# Patient Record
Sex: Male | Born: 1960 | Race: Black or African American | Hispanic: No | Marital: Married | Smoking: Never smoker
Health system: Southern US, Community
[De-identification: ages and names within clinical notes are randomized; demographics above are authoritative.]

## PROBLEM LIST (undated history)

## (undated) DIAGNOSIS — I10 Essential (primary) hypertension: Secondary | ICD-10-CM

---

## 1998-04-28 ENCOUNTER — Ambulatory Visit (HOSPITAL_COMMUNITY): Admission: RE | Admit: 1998-04-28 | Discharge: 1998-04-28 | Payer: Self-pay | Admitting: Infectious Diseases

## 2009-01-02 ENCOUNTER — Encounter: Admission: RE | Admit: 2009-01-02 | Discharge: 2009-01-02 | Payer: Self-pay | Admitting: Cardiovascular Disease

## 2009-02-14 ENCOUNTER — Encounter: Admission: RE | Admit: 2009-02-14 | Discharge: 2009-02-14 | Payer: Self-pay | Admitting: Cardiovascular Disease

## 2016-07-15 ENCOUNTER — Emergency Department (HOSPITAL_COMMUNITY)
Admission: EM | Admit: 2016-07-15 | Discharge: 2016-07-15 | Disposition: A | Discharge status: Home / Self Care | Payer: Commercial Managed Care - PPO | Attending: Emergency Medicine | Admitting: Emergency Medicine

## 2016-07-15 ENCOUNTER — Encounter (HOSPITAL_COMMUNITY): Payer: Self-pay | Admitting: Emergency Medicine

## 2016-07-15 ENCOUNTER — Emergency Department (HOSPITAL_COMMUNITY): Payer: Commercial Managed Care - PPO

## 2016-07-15 DIAGNOSIS — I10 Essential (primary) hypertension: Secondary | ICD-10-CM | POA: Diagnosis not present

## 2016-07-15 DIAGNOSIS — Y999 Unspecified external cause status: Secondary | ICD-10-CM | POA: Insufficient documentation

## 2016-07-15 DIAGNOSIS — S3992XA Unspecified injury of lower back, initial encounter: Secondary | ICD-10-CM | POA: Diagnosis present

## 2016-07-15 DIAGNOSIS — S39012A Strain of muscle, fascia and tendon of lower back, initial encounter: Secondary | ICD-10-CM | POA: Insufficient documentation

## 2016-07-15 DIAGNOSIS — Y92410 Unspecified street and highway as the place of occurrence of the external cause: Secondary | ICD-10-CM | POA: Insufficient documentation

## 2016-07-15 DIAGNOSIS — Z79899 Other long term (current) drug therapy: Secondary | ICD-10-CM | POA: Insufficient documentation

## 2016-07-15 DIAGNOSIS — Y939 Activity, unspecified: Secondary | ICD-10-CM | POA: Insufficient documentation

## 2016-07-15 HISTORY — DX: Essential (primary) hypertension: I10

## 2016-07-15 MED ORDER — CYCLOBENZAPRINE HCL 10 MG PO TABS
10.0000 mg | ORAL_TABLET | Freq: Once | ORAL | Status: AC
  Administered 2016-07-15: 10 mg via ORAL
  Filled 2016-07-15: qty 1

## 2016-07-15 MED ORDER — CYCLOBENZAPRINE HCL 10 MG PO TABS: 10.0000 mg | ORAL_TABLET | Freq: Two times a day (BID) | ORAL | 0 refills | Status: DC | PRN

## 2016-07-15 MED ORDER — NAPROXEN 500 MG PO TABS: 500.0000 mg | ORAL_TABLET | Freq: Two times a day (BID) | ORAL | 0 refills | Status: DC
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
## 2016-07-15 NOTE — ED Notes (Signed)
See providers assessment.  

## 2016-07-15 NOTE — Discharge Instructions (Signed)
Do not drive while taking the muscle relaxant as it will make you sleepy. °

## 2016-08-31 ENCOUNTER — Other Ambulatory Visit: Payer: Self-pay | Admitting: Cardiovascular Disease

## 2016-08-31 ENCOUNTER — Ambulatory Visit
Admission: RE | Admit: 2016-08-31 | Discharge: 2016-08-31 | Disposition: A | Discharge status: Home / Self Care | Payer: Commercial Managed Care - PPO | Source: Ambulatory Visit | Attending: Cardiovascular Disease | Admitting: Cardiovascular Disease

## 2016-08-31 DIAGNOSIS — T148XXA Other injury of unspecified body region, initial encounter: Secondary | ICD-10-CM

## 2016-09-28 ENCOUNTER — Ambulatory Visit (INDEPENDENT_AMBULATORY_CARE_PROVIDER_SITE_OTHER): Payer: Commercial Managed Care - PPO | Admitting: Physician Assistant

## 2016-09-28 VITALS — BP 136/80 | HR 76 | Temp 98.2°F | Resp 16 | Ht 67.0 in | Wt 216.0 lb

## 2016-09-28 DIAGNOSIS — B9789 Other viral agents as the cause of diseases classified elsewhere: Secondary | ICD-10-CM

## 2016-09-28 DIAGNOSIS — J069 Acute upper respiratory infection, unspecified: Secondary | ICD-10-CM | POA: Diagnosis not present

## 2016-09-28 MED ORDER — IPRATROPIUM BROMIDE 0.03 % NA SOLN: 2.0000 | Freq: Two times a day (BID) | NASAL | 0 refills | Status: DC

## 2016-09-28 MED ORDER — GUAIFENESIN ER 1200 MG PO TB12: 1.0000 | ORAL_TABLET | Freq: Two times a day (BID) | ORAL | 1 refills | Status: DC | PRN

## 2016-09-28 NOTE — Progress Notes (Signed)
[facility] [address], [place] [id number] [phone]  Date:  09/28/2016   Name:  Geoffrey Thompson   DOB:  1961-05-23   MRN:  [id number]  PCP:  Ricki RodriguezKADAKIA,AJAY S, MD    History of Present Illness:  Geoffrey Thompson is a 56 y.o. male patient [facility] of generalized body aches, and nasal congestion. Patient reports symptoms that started about 3 days ago.   He has no sore throat.  Nasal congestion and some runny-nose. He has no fever.  No chills or malaise.     There are no active problems to display for this patient.   Past Medical History:  Diagnosis Date  . Hypertension     No past surgical history on file.  Social History  Substance Use Topics  . Smoking status: Never Smoker  . Smokeless tobacco: Never Used  . Alcohol use No    No family history on file.  No Known Allergies  Medication list has been reviewed and updated.  No current outpatient prescriptions on file prior to visit.   No current facility-administered medications on file prior to visit.     ROS ROS otherwise unremarkable unless listed above.  Physical Examination: BP 136/80   Pulse 76   Temp 98.2 F (36.8 C) (Oral)   Resp 16   Ht 5\' 7"  (1.702 m)   Wt 216 lb (98 kg)   SpO2 98%   BMI 33.83 kg/m  Ideal Body Weight: Weight in (lb) to have BMI = 25: 159.3  Physical Exam  Constitutional: He is oriented to person, place, and time. He appears well-developed and well-nourished. No distress.  HENT:  Head: Normocephalic and atraumatic.  Right Ear: Tympanic membrane, external ear and ear canal normal.  Left Ear: Tympanic membrane, external ear and ear canal normal.  Nose: Mucosal edema and rhinorrhea present. Right sinus exhibits no maxillary sinus tenderness and no frontal sinus tenderness. Left sinus exhibits no maxillary sinus tenderness and no frontal sinus tenderness.  Mouth/Throat: No uvula swelling. No oropharyngeal exudate, posterior oropharyngeal  edema or posterior oropharyngeal erythema.  Eyes: Conjunctivae, EOM and lids are normal. Pupils are equal, round, and reactive to light. Right eye exhibits normal extraocular motion. Left eye exhibits normal extraocular motion.  Neck: Trachea normal and full passive range of motion without pain. No edema and no erythema present.  Cardiovascular: Normal rate.   Pulmonary/Chest: Effort normal. No respiratory distress. He has no decreased breath sounds. He has no wheezes. He has no rhonchi.  Neurological: He is alert and oriented to person, place, and time.  Skin: Skin is warm and dry. He is not diaphoretic.  Psychiatric: He has a normal mood and affect. His behavior is normal.     Assessment and Plan: Geoffrey Thompson is a 56 y.o. male who is here today for cc of nasal congestion and nausea. Likely viral.  Supportive treatment and anti-pyretic use discussed.  Follow up as needed.  1. Viral upper respiratory infection - Guaifenesin (MUCINEX MAXIMUM STRENGTH) 1200 MG TB12; Take 1 tablet (1,200 mg total) by mouth every 12 (twelve) hours as needed.  Dispense: 14 tablet; Refill: 1 - ipratropium (ATROVENT) 0.03 % nasal spray; Place 2 sprays into both nostrils 2 (two) times daily.  Dispense: 30 mL; Refill: 0   Trena PlattStephanie English, PA-C [facility] 09/28/2016 7:01 PM

## 2016-09-28 NOTE — Patient Instructions (Addendum)
64 oz of water if not more. Please take tylenol or ibuprofen, for pain or fever.  Upper Respiratory Infection, Adult Most upper respiratory infections (URIs) are caused by a virus. A URI affects the nose, throat, and upper air passages. The most common type of URI is often called "the common cold." Follow these instructions at home:  Take medicines only as told by your doctor.  Gargle warm saltwater or take cough drops to comfort your throat as told by your doctor.  Use a warm mist humidifier or inhale steam from a shower to increase air moisture. This may make it easier to breathe.  Drink enough fluid to keep your pee (urine) clear or pale yellow.  Eat soups and other clear broths.  Have a healthy diet.  Rest as needed.  Go back to work when your fever is gone or your doctor says it is okay.  You may need to stay home longer to avoid giving your URI to others.  You can also wear a face mask and wash your hands often to prevent spread of the virus.  Use your inhaler more if you have asthma.  Do not use any tobacco products, including cigarettes, chewing tobacco, or electronic cigarettes. If you need help quitting, ask your doctor. Contact a doctor if:  You are getting worse, not better.  Your symptoms are not helped by medicine.  You have chills.  You are getting more short of breath.  You have brown or red mucus.  You have yellow or brown discharge from your nose.  You have pain in your face, especially when you bend forward.  You have a fever.  You have puffy (swollen) neck glands.  You have pain while swallowing.  You have white areas in the back of your throat. Get help right away if:  You have very bad or constant:  Headache.  Ear pain.  Pain in your forehead, behind your eyes, and over your cheekbones (sinus pain).  Chest pain.  You have long-lasting (chronic) lung disease and any of the following:  Wheezing.  Long-lasting cough.  Coughing  up blood.  A change in your usual mucus.  You have a stiff neck.  You have changes in your:  Vision.  Hearing.  Thinking.  Mood. This information is not intended to replace advice given to you by your health care provider. Make sure you discuss any questions you have with your health care provider. Document Released: 01/04/2008 Document Revised: 03/20/2016 Document Reviewed: 10/23/2013 Elsevier Interactive Patient Education  2017 [employer]     IF you received an x-ray today, you will receive an invoice from [facility]. Please contact [facility] at [phone] with questions or concerns regarding your invoice.   IF you received labwork today, you will receive an invoice from [facility]. Please contact [facility] at [phone] with questions or concerns regarding your invoice.   Our billing staff will not be able to assist you with questions regarding bills from these companies.  You will be contacted with the lab results as soon as they are available. The fastest way to get your results is to activate your My Chart account. Instructions are located on the last page of this paperwork. If you have not heard from us regarding the results in 2 weeks, please contact this office.

## 2016-10-26 ENCOUNTER — Other Ambulatory Visit: Payer: Self-pay | Admitting: Cardiovascular Disease

## 2016-10-26 DIAGNOSIS — M79672 Pain in left foot: Secondary | ICD-10-CM

## 2016-10-26 DIAGNOSIS — S99922A Unspecified injury of left foot, initial encounter: Secondary | ICD-10-CM

## 2016-11-07 ENCOUNTER — Ambulatory Visit
Admission: RE | Admit: 2016-11-07 | Discharge: 2016-11-07 | Disposition: A | Discharge status: Home / Self Care | Payer: Commercial Managed Care - PPO | Source: Ambulatory Visit | Attending: Cardiovascular Disease | Admitting: Cardiovascular Disease

## 2016-11-07 DIAGNOSIS — M79672 Pain in left foot: Secondary | ICD-10-CM

## 2016-11-07 DIAGNOSIS — S99922A Unspecified injury of left foot, initial encounter: Secondary | ICD-10-CM

## 2016-11-24 ENCOUNTER — Ambulatory Visit (INDEPENDENT_AMBULATORY_CARE_PROVIDER_SITE_OTHER): Payer: Commercial Managed Care - PPO | Admitting: Family Medicine

## 2016-11-24 VITALS — BP 134/88 | HR 91 | Temp 99.7°F | Resp 16 | Ht 67.0 in | Wt 218.0 lb

## 2016-11-24 DIAGNOSIS — J069 Acute upper respiratory infection, unspecified: Secondary | ICD-10-CM | POA: Diagnosis not present

## 2016-11-24 DIAGNOSIS — R062 Wheezing: Secondary | ICD-10-CM

## 2016-11-24 DIAGNOSIS — J301 Allergic rhinitis due to pollen: Secondary | ICD-10-CM | POA: Diagnosis not present

## 2016-11-24 MED ORDER — ALBUTEROL SULFATE HFA 108 (90 BASE) MCG/ACT IN AERS: 2.0000 | INHALATION_SPRAY | Freq: Four times a day (QID) | RESPIRATORY_TRACT | 0 refills | Status: DC | PRN

## 2016-11-24 MED ORDER — FLUTICASONE PROPIONATE 50 MCG/ACT NA SUSP: 2.0000 | Freq: Every day | NASAL | 6 refills | Status: DC

## 2016-11-24 NOTE — Patient Instructions (Addendum)
It was good to meet you today.  You do have some swelling in your nose and wheezing in your lungs.  I think you have a combination of allergies and a cold.  Use the Flonase to help with your nose.  Use the Albuterol to help with the wheezing in your lungs.    Communicate with me through Mychart if you're starting to feel worse.      IF you received an x-ray today, you will receive an invoice from [facility]. Please contact [facility] at [phone] with questions or concerns regarding your invoice.   IF you received labwork today, you will receive an invoice from [facility]. Please contact [facility] at [phone] with questions or concerns regarding your invoice.   Our billing staff will not be able to assist you with questions regarding bills from these companies.  You will be contacted with the lab results as soon as they are available. The fastest way to get your results is to activate your My Chart account. Instructions are located on the last page of this paperwork. If you have not heard from [place] regarding the results in 2 weeks, please contact this office.

## 2016-11-24 NOTE — Progress Notes (Signed)
   Geoffrey Thompson is a 56 y.o. male who presents to Primary Care at [facility] today for wheezing:  1.  Wheezing:  Patient without diagnosed asthma who presents with cough and wheezing for past several days.  Describes wheezing and cough worse at night.  He hasn't tried anything for relief.  Cough is dry and nonproductive.  Eating and drinking well.  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  No myalgias.  No N/V.    Not diagnosed with seasonal allergies.  Does have some increased nasal drainage and pressure. Drainage is clear.  No fevers or chills.    Hasn't tried anything for relief.   ROS as above.    PMH reviewed. Patient is a nonsmoker.   Past Medical History:  Diagnosis Date  . Hypertension    No past surgical history on file.  Medications reviewed. Current Outpatient Prescriptions  Medication Sig Dispense Refill  . glipiZIDE (GLUCOTROL XL) 2.5 MG 24 hr tablet Take 2.5 mg by mouth daily with breakfast.    . Multiple Vitamins-Minerals (MULTIVITAMIN ADULT PO) Take by mouth.    . olmesartan-hydrochlorothiazide (BENICAR HCT) 40-12.5 MG tablet Take 1 tablet by mouth daily.    . Guaifenesin (MUCINEX MAXIMUM STRENGTH) 1200 MG TB12 Take 1 tablet (1,200 mg total) by mouth every 12 (twelve) hours as needed. (Patient not taking: Reported on 11/24/2016) 14 tablet 1  . ipratropium (ATROVENT) 0.03 % nasal spray Place 2 sprays into both nostrils 2 (two) times daily. (Patient not taking: Reported on 11/24/2016) 30 mL 0   No current facility-administered medications for this visit.      Physical Exam:  BP 134/88   Pulse 91   Temp 99.7 F (37.6 C) (Oral)   Resp 16   Ht  (1.702 m)   Wt 218 lb (98.9 kg)   SpO2 99%   BMI 34.14 kg/m  Gen:  Patient sitting on exam table, appears stated age in no acute distress Head: Normocephalic atraumatic Eyes: EOMI, PERRL, sclera and conjunctiva non-erythematous Ears:  Canals clear bilaterally.  TMs pearly gray bilaterally without erythema or bulging.    Nose:  Nasal turbinates grossly enlarged bilaterally and boggy appearing.  Clear drainage BL.   Mouth: Mucosa membranes moist. Tonsils +2, nonenlarged, non-erythematous. Neck: No cervical lymphadenopathy noted Heart:  RRR, no murmurs auscultated. Pulm:  Minimal scattered wheezes at bases today.     Assessment and Plan:  1.  Seasonal allergies: - with swelling of turbinates BL  - Flonase to treat.  Also OTC cetirizine  2.  Viral URI: - possibly also viral URI - should run its course in next several days.   3.  Wheezing: - not diagnosed with asthma, but seems to have at least 2 atopic symptoms: allergies plus wheezing.   - FU with [place] or PCP in 4 - 6 weeks once this is clear for PFT testing.

## 2016-11-29 ENCOUNTER — Ambulatory Visit (INDEPENDENT_AMBULATORY_CARE_PROVIDER_SITE_OTHER): Payer: Commercial Managed Care - PPO | Admitting: Urgent Care

## 2016-11-29 ENCOUNTER — Encounter: Payer: Self-pay | Admitting: Urgent Care

## 2016-11-29 ENCOUNTER — Ambulatory Visit (INDEPENDENT_AMBULATORY_CARE_PROVIDER_SITE_OTHER): Payer: Commercial Managed Care - PPO

## 2016-11-29 VITALS — BP 138/84 | HR 89 | Temp 98.2°F | Resp 18 | Ht 67.0 in | Wt 212.0 lb

## 2016-11-29 DIAGNOSIS — R05 Cough: Secondary | ICD-10-CM

## 2016-11-29 DIAGNOSIS — R062 Wheezing: Secondary | ICD-10-CM | POA: Diagnosis not present

## 2016-11-29 DIAGNOSIS — J301 Allergic rhinitis due to pollen: Secondary | ICD-10-CM

## 2016-11-29 DIAGNOSIS — J189 Pneumonia, unspecified organism: Secondary | ICD-10-CM | POA: Diagnosis not present

## 2016-11-29 DIAGNOSIS — R059 Cough, unspecified: Secondary | ICD-10-CM

## 2016-11-29 MED ORDER — CETIRIZINE HCL 10 MG PO TABS: 10.0000 mg | ORAL_TABLET | Freq: Every day | ORAL | 11 refills | Status: DC

## 2016-11-29 MED ORDER — HYDROCODONE-HOMATROPINE 5-1.5 MG/5ML PO SYRP: 5.0000 mL | ORAL_SOLUTION | Freq: Every evening | ORAL | 0 refills | Status: DC | PRN

## 2016-11-29 MED ORDER — AZITHROMYCIN 250 MG PO TABS: ORAL_TABLET | ORAL | 0 refills | Status: DC

## 2016-11-29 MED ORDER — BENZONATATE 100 MG PO CAPS: 100.0000 mg | ORAL_CAPSULE | Freq: Three times a day (TID) | ORAL | 0 refills | Status: DC | PRN

## 2016-11-29 NOTE — Progress Notes (Signed)
  MRN: [id number] DOB: [birth year]  Subjective:   Geoffrey Thompson is a 56 y.o. male presenting for chief complaint of Follow-up (Seasonal allergic rhinitis. )  Last OV was 11/24/2016, was managed for allergic rhinitis with Flonase and supportive care for viral uri. Patient was supposed to f/u for wheezing, PFT testing in 4-6 weeks. However, presents today reporting ongoing wheezing, productive cough, chest tightness, nasal congestion. Cough elicits throat pain. Denies fever, chest pain, shob. Has tried to use Atrovent nasal spray with minimal relief. He is also using albuterol inhaler every 6 hours as needed but does not notice a significant difference in his symptoms. He is also using Claritin on occasion. Denies smoking cigarettes. Denies family history of lung cancer, sarcoidosis.  Geoffrey Thompson has a current medication list which includes the following prescription(s): albuterol, fluticasone, glipizide, multiple vitamins-minerals, olmesartan-hydrochlorothiazide, guaifenesin, and ipratropium. Also has No Known Allergies. Geoffrey Thompson  has a past medical history of Hypertension. Also denies past surgical history.  Objective:   Vitals: BP 138/84   Pulse 89   Temp 98.2 F (36.8 C) (Oral)   Resp 18   Ht  (1.702 m)   Wt 212 lb (96.2 kg)   SpO2 97%   BMI 33.20 kg/m   Physical Exam  Constitutional: He is oriented to person, place, and time. He appears well-developed and well-nourished.  HENT:  TM's intact bilaterally, no effusions or erythema. Nasal turbinates violaceous and boggy, nasal passages moderately patent. No sinus tenderness. Throat with moderate post-nasal drainage, mucous membranes moist.  Eyes: Right eye exhibits no discharge. Left eye exhibits no discharge. No scleral icterus.  Neck: Normal range of motion. Neck supple.  Cardiovascular: Normal rate, regular rhythm and intact distal pulses.  Exam reveals no gallop and no friction rub.   No murmur heard. Pulmonary/Chest: No  respiratory distress. He has wheezes (mid-lower left lung fields). He has no rales.  Lymphadenopathy:    He has no cervical adenopathy.  Neurological: He is alert and oriented to person, place, and time.  Skin: Skin is warm and dry.   Dg Chest 2 View  Result Date: 11/29/2016 CLINICAL DATA:  Cough and wheezing. EXAM: CHEST  2 VIEW COMPARISON:  None. FINDINGS: The heart size and mediastinal contours are within normal limits. Airspace disease is seen in the lingula, suspicious for pneumonia. Right lung is clear. No evidence of pleural effusion. IMPRESSION: Lingular airspace disease, suspicious for pneumonia. Followup PA and lateral chest X-ray is recommended in 3-4 weeks following trial of antibiotic therapy to ensure resolution and exclude underlying malignancy. Electronically Signed   By: Myles Rosenthal M.D.   On: 11/29/2016 17:33   Assessment and Plan :   1. Lingular pneumonia 2. Cough 3. Wheezing - Start azithromycin to address pneumonia. Supportive care is advised otherwise. Patient is to rtc in 4 weeks for repeat x-ray.  4. Seasonal allergic rhinitis due to pollen - Patient is to maintain allergy treatment regimen.  - cetirizine (ZYRTEC) 10 MG tablet; Take 1 tablet (10 mg total) by mouth daily.  Dispense: 30 tablet; Refill: 11   Wallis Bamberg, [place] [facility] [phone] 11/29/2016  5:07 PM

## 2016-11-29 NOTE — Patient Instructions (Addendum)
Community-Acquired Pneumonia, Adult Pneumonia is an infection of the lungs. There are different types of pneumonia. One type can develop while a person is in a hospital. A different type, called community-acquired pneumonia, develops in people who are not, or have not recently been, in the hospital or other health care facility. What are the causes? Pneumonia may be caused by bacteria, viruses, or funguses. Community-acquired pneumonia is often caused by Streptococcus pneumonia bacteria. These bacteria are often passed from one person to another by breathing in droplets from the cough or sneeze of an infected person. What increases the risk? The condition is more likely to develop in:  People who havechronic diseases, such as chronic obstructive pulmonary disease (COPD), asthma, congestive heart failure, cystic fibrosis, diabetes, or kidney disease.  People who haveearly-stage or late-stage HIV.  People who havesickle cell disease.  People who havehad their spleen removed (splenectomy).  People who havepoor Human resources officer.  People who havemedical conditions that increase the risk of breathing in (aspirating) secretions their own mouth and nose.  People who havea weakened immune system (immunocompromised).  People who smoke.  People whotravel to areas where pneumonia-causing germs commonly exist.  People whoare around animal habitats or animals that have pneumonia-causing germs, including birds, bats, rabbits, cats, and farm animals. What are the signs or symptoms? Symptoms of this condition include:  Adry cough.  A wet (productive) cough.  Fever.  Sweating.  Chest pain, especially when breathing deeply or coughing.  Rapid breathing or difficulty breathing.  Shortness of breath.  Shaking chills.  Fatigue.  Muscle aches. How is this diagnosed? Your health care provider will take a medical history and perform a physical exam. You may also have other tests,  including:  Imaging studies of your chest, including X-rays.  Tests to check your blood oxygen level and other blood gases.  Other tests on blood, mucus (sputum), fluid around your lungs (pleural fluid), and urine. If your pneumonia is severe, other tests may be done to identify the specific cause of your illness. How is this treated? The type of treatment that you receive depends on many factors, such as the cause of your pneumonia, the medicines you take, and other medical conditions that you have. For most adults, treatment and recovery from pneumonia may occur at home. In some cases, treatment must happen in a hospital. Treatment may include:  Antibiotic medicines, if the pneumonia was caused by bacteria.  Antiviral medicines, if the pneumonia was caused by a virus.  Medicines that are given by mouth or through an IV tube.  Oxygen.  Respiratory therapy. Although rare, treating severe pneumonia may include:  Mechanical ventilation. This is done if you are not breathing well on your own and you cannot maintain a safe blood oxygen level.  Thoracentesis. This procedureremoves fluid around one lung or both lungs to help you breathe better. Follow these instructions at home:  Take over-the-counter and prescription medicines only as told by your health care provider.  Only takecough medicine if you are losing sleep. Understand that cough medicine can prevent your body's natural ability to remove mucus from your lungs.  If you were prescribed an antibiotic medicine, take it as told by your health care provider. Do not stop taking the antibiotic even if you start to feel better.  Sleep in a semi-upright position at night. Try sleeping in a reclining chair, or place a few pillows under your head.  Do not use tobacco products, including cigarettes, chewing tobacco, and e-cigarettes. If you  tobacco, and e-cigarettes. If you need help quitting, ask your health care provider.  Drink enough water to keep your urine  clear or pale yellow. This will help to thin out mucus secretions in your lungs. How is this prevented? There are ways that you can decrease your risk of developing community-acquired pneumonia. Consider getting a pneumococcal vaccine if:  You are older than 57 years of age.  You are older than 57 years of age and are undergoing cancer treatment, have chronic lung disease, or have other medical conditions that affect your immune system. Ask your health care provider if this applies to you.  There are different types and schedules of pneumococcal vaccines. Ask your health care provider which vaccination option is best for you. You may also prevent community-acquired pneumonia if you take these actions:  Get an influenza vaccine every year. Ask your health care provider which type of influenza vaccine is best for you.  Go to the dentist on a regular basis.  Wash your hands often. Use hand sanitizer if soap and water are not available.  Contact a health care provider if:  You have a fever.  You are losing sleep because you cannot control your cough with cough medicine. Get help right away if:  You have worsening shortness of breath.  You have increased chest pain.  Your sickness becomes worse, especially if you are an older adult or have a weakened immune system.  You cough up blood. This information is not intended to replace advice given to you by your health care provider. Make sure you discuss any questions you have with your health care provider. Document Released: 07/18/2005 Document Revised: 11/26/2015 Document Reviewed: 11/12/2014 Elsevier Interactive Patient Education  2017 [employer]    IF you received an x-ray today, you will receive an invoice from [facility]. Please contact [facility] at [phone] with questions or concerns regarding your invoice.   IF you received labwork today, you will receive an invoice from [facility]. Please contact  [facility] at [phone] with questions or concerns regarding your invoice.   Our billing staff will not be able to assist you with questions regarding bills from these companies.  You will be contacted with the lab results as soon as they are available. The fastest way to get your results is to activate your My Chart account. Instructions are located on the last page of this paperwork. If you have not heard from us regarding the results in 2 weeks, please contact this office.      

## 2016-12-27 ENCOUNTER — Encounter: Payer: Self-pay | Admitting: Urgent Care

## 2016-12-27 ENCOUNTER — Ambulatory Visit (INDEPENDENT_AMBULATORY_CARE_PROVIDER_SITE_OTHER): Payer: Commercial Managed Care - PPO

## 2016-12-27 ENCOUNTER — Ambulatory Visit (INDEPENDENT_AMBULATORY_CARE_PROVIDER_SITE_OTHER): Payer: Commercial Managed Care - PPO | Admitting: Urgent Care

## 2016-12-27 VITALS — BP 120/72 | HR 81 | Temp 98.0°F | Resp 16 | Ht 66.0 in | Wt 212.4 lb

## 2016-12-27 DIAGNOSIS — R05 Cough: Secondary | ICD-10-CM | POA: Diagnosis not present

## 2016-12-27 DIAGNOSIS — J189 Pneumonia, unspecified organism: Secondary | ICD-10-CM | POA: Diagnosis not present

## 2016-12-27 DIAGNOSIS — R062 Wheezing: Secondary | ICD-10-CM | POA: Diagnosis not present

## 2016-12-27 DIAGNOSIS — R059 Cough, unspecified: Secondary | ICD-10-CM

## 2016-12-27 DIAGNOSIS — Z8701 Personal history of pneumonia (recurrent): Secondary | ICD-10-CM

## 2016-12-27 NOTE — Progress Notes (Signed)
    MRN: [id number] DOB: 24-Jan-1961  Subjective:   Geoffrey Thompson is a 56 y.o. male presenting for follow up on pneumonia. Last OV was 11/29/2016, diagnosed with lingular pneumonia. He underwent treatment with course of azithromycin. Recommendation was to f/u in 3-4 weeks for repeat chest x-ray and evaluation of resolution of his pneumonia. Today, denies fever, cough, chest pain, shob, wheezing.    Geoffrey Thompson has a current medication list which includes the following prescription(s): albuterol, azithromycin, benzonatate, cetirizine, fluticasone, glipizide, hydrocodone-homatropine, multiple vitamins-minerals, and olmesartan-hydrochlorothiazide. Also has No Known Allergies.  Geoffrey Thompson  has a past medical history of Hypertension. Also  has no past surgical history on file.  Objective:   Vitals: BP 120/72 (BP Location: Right Arm, Patient Position: Sitting, Cuff Size: Normal)   Pulse 81   Temp 98 F (36.7 C) (Oral)   Resp 16   Ht 5\' 6"  (1.676 m)   Wt 212 lb 6.4 oz (96.3 kg)   SpO2 98%   BMI 34.28 kg/m   Physical Exam  Constitutional: He is oriented to person, place, and time. He appears well-developed and well-nourished.  HENT:  Mouth/Throat: Oropharynx is clear and moist.  Cardiovascular: Normal rate, regular rhythm and intact distal pulses.  Exam reveals no gallop and no friction rub.   No murmur heard. Pulmonary/Chest: No respiratory distress. He has no wheezes. He has no rales.  Neurological: He is alert and oriented to person, place, and time.  Psychiatric: He has a normal mood and affect.   Dg Chest 2 View  Result Date: 12/27/2016 CLINICAL DATA:  Previous lingular pneumonia.  Follow-up. EXAM: CHEST  2 VIEW COMPARISON:  11/29/2016 FINDINGS: Heart size is normal. Mediastinal shadows are normal. The lungs are clear. No bronchial thickening. No infiltrate, mass, effusion or collapse. Pulmonary vascularity is normal. No bony abnormality. IMPRESSION: Normal chest.  Resolution of previous  findings. Electronically Signed   By: Paulina FusiMark  Shogry M.D.   On: 12/27/2016 17:59   Assessment and Plan :   1. History of pneumonia 2. Lingular pneumonia 3. Cough 4. Wheezing - Resolved. Anticipatory guidance provided.  Wallis BambergMario Jasalyn Frysinger, PA-C [facility] [phone] 12/27/2016 5:44 PM

## 2016-12-27 NOTE — Patient Instructions (Signed)
     IF you received an x-ray today, you will receive an invoice from [facility]. Please contact [facility] at [phone] with questions or concerns regarding your invoice.   IF you received labwork today, you will receive an invoice from [facility]. Please contact [facility] at [phone] with questions or concerns regarding your invoice.   Our billing staff will not be able to assist you with questions regarding bills from these companies.  You will be contacted with the lab results as soon as they are available. The fastest way to get your results is to activate your My Chart account. Instructions are located on the last page of this paperwork. If you have not heard from us regarding the results in 2 weeks, please contact this office.     

## 2017-04-21 ENCOUNTER — Ambulatory Visit: Payer: Commercial Managed Care - PPO | Admitting: Family Medicine

## 2017-10-09 ENCOUNTER — Emergency Department (HOSPITAL_COMMUNITY)
Admission: EM | Admit: 2017-10-09 | Discharge: 2017-10-09 | Disposition: A | Discharge status: Home / Self Care | Payer: PRIVATE HEALTH INSURANCE | Attending: Emergency Medicine | Admitting: Emergency Medicine

## 2017-10-09 ENCOUNTER — Emergency Department (HOSPITAL_COMMUNITY): Payer: PRIVATE HEALTH INSURANCE

## 2017-10-09 ENCOUNTER — Encounter (HOSPITAL_COMMUNITY): Payer: Self-pay | Admitting: Emergency Medicine

## 2017-10-09 DIAGNOSIS — Z7984 Long term (current) use of oral hypoglycemic drugs: Secondary | ICD-10-CM | POA: Diagnosis not present

## 2017-10-09 DIAGNOSIS — Y999 Unspecified external cause status: Secondary | ICD-10-CM | POA: Insufficient documentation

## 2017-10-09 DIAGNOSIS — Z79899 Other long term (current) drug therapy: Secondary | ICD-10-CM | POA: Insufficient documentation

## 2017-10-09 DIAGNOSIS — Y929 Unspecified place or not applicable: Secondary | ICD-10-CM | POA: Diagnosis not present

## 2017-10-09 DIAGNOSIS — Y939 Activity, unspecified: Secondary | ICD-10-CM | POA: Insufficient documentation

## 2017-10-09 DIAGNOSIS — S161XXA Strain of muscle, fascia and tendon at neck level, initial encounter: Secondary | ICD-10-CM

## 2017-10-09 DIAGNOSIS — S199XXA Unspecified injury of neck, initial encounter: Secondary | ICD-10-CM | POA: Diagnosis present

## 2017-10-09 DIAGNOSIS — I10 Essential (primary) hypertension: Secondary | ICD-10-CM | POA: Insufficient documentation

## 2017-10-09 MED ORDER — DICLOFENAC SODIUM 50 MG PO TBEC
50.0000 mg | DELAYED_RELEASE_TABLET | Freq: Two times a day (BID) | ORAL | 0 refills | Status: DC
Start: 2017-10-09 — End: 2018-07-21

## 2017-10-09 MED ORDER — CYCLOBENZAPRINE HCL 10 MG PO TABS: 10.0000 mg | ORAL_TABLET | Freq: Two times a day (BID) | ORAL | 0 refills | Status: DC | PRN

## 2017-10-09 MED ORDER — IBUPROFEN 200 MG PO TABS
600.0000 mg | ORAL_TABLET | Freq: Once | ORAL | Status: AC
  Administered 2017-10-09: 600 mg via ORAL
  Filled 2017-10-09: qty 3

## 2017-10-09 MED ORDER — CYCLOBENZAPRINE HCL 10 MG PO TABS
10.0000 mg | ORAL_TABLET | Freq: Once | ORAL | Status: AC
Start: 2017-10-09 — End: 2017-10-09
  Administered 2017-10-09: 10 mg via ORAL
  Filled 2017-10-09: qty 1
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
## 2017-10-09 NOTE — Discharge Instructions (Signed)
Do not drive or work while taking the muscle relaxer as it will make you sleepy. Follow up with your doctor in the next few days for recheck. Return here if symptoms worsen.
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
## 2018-05-11 ENCOUNTER — Emergency Department (HOSPITAL_COMMUNITY)
Admission: EM | Admit: 2018-05-11 | Discharge: 2018-05-11 | Disposition: A | Discharge status: Home / Self Care | Payer: PRIVATE HEALTH INSURANCE | Attending: Emergency Medicine | Admitting: Emergency Medicine

## 2018-05-11 ENCOUNTER — Encounter (HOSPITAL_COMMUNITY): Payer: Self-pay | Admitting: Emergency Medicine

## 2018-05-11 ENCOUNTER — Emergency Department (HOSPITAL_COMMUNITY): Payer: PRIVATE HEALTH INSURANCE

## 2018-05-11 ENCOUNTER — Other Ambulatory Visit: Payer: Self-pay

## 2018-05-11 DIAGNOSIS — Z79899 Other long term (current) drug therapy: Secondary | ICD-10-CM | POA: Diagnosis not present

## 2018-05-11 DIAGNOSIS — I10 Essential (primary) hypertension: Secondary | ICD-10-CM | POA: Insufficient documentation

## 2018-05-11 DIAGNOSIS — S161XXA Strain of muscle, fascia and tendon at neck level, initial encounter: Secondary | ICD-10-CM | POA: Insufficient documentation

## 2018-05-11 DIAGNOSIS — Y92410 Unspecified street and highway as the place of occurrence of the external cause: Secondary | ICD-10-CM | POA: Insufficient documentation

## 2018-05-11 DIAGNOSIS — S1980XA Other specified injuries of unspecified part of neck, initial encounter: Secondary | ICD-10-CM | POA: Diagnosis present

## 2018-05-11 DIAGNOSIS — Y999 Unspecified external cause status: Secondary | ICD-10-CM | POA: Insufficient documentation

## 2018-05-11 DIAGNOSIS — Y939 Activity, unspecified: Secondary | ICD-10-CM | POA: Insufficient documentation

## 2018-05-11 MED ORDER — IBUPROFEN 600 MG PO TABS: 600.0000 mg | ORAL_TABLET | Freq: Four times a day (QID) | ORAL | 0 refills | Status: DC | PRN

## 2018-05-11 MED ORDER — KETOROLAC TROMETHAMINE 30 MG/ML IJ SOLN
30.0000 mg | Freq: Once | INTRAMUSCULAR | Status: AC
  Administered 2018-05-11: 30 mg via INTRAMUSCULAR
  Filled 2018-05-11: qty 1

## 2018-05-11 MED ORDER — METHOCARBAMOL 500 MG PO TABS: 500.0000 mg | ORAL_TABLET | Freq: Two times a day (BID) | ORAL | 0 refills | Status: DC

## 2018-05-11 NOTE — ED Notes (Signed)
Patient given discharge teaching and verbalized understanding. Patient taken out of ED with a wheelchair.  
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
## 2018-07-21 ENCOUNTER — Encounter (HOSPITAL_COMMUNITY): Payer: Self-pay

## 2018-07-21 ENCOUNTER — Ambulatory Visit (HOSPITAL_COMMUNITY)
Admission: EM | Admit: 2018-07-21 | Discharge: 2018-07-21 | Disposition: A | Discharge status: Home / Self Care | Payer: PRIVATE HEALTH INSURANCE | Attending: Family Medicine | Admitting: Family Medicine

## 2018-07-21 DIAGNOSIS — R6889 Other general symptoms and signs: Secondary | ICD-10-CM | POA: Diagnosis not present

## 2018-07-21 DIAGNOSIS — M940 Chondrocostal junction syndrome [Tietze]: Secondary | ICD-10-CM | POA: Insufficient documentation

## 2018-07-21 MED ORDER — OSELTAMIVIR PHOSPHATE 75 MG PO CAPS: 75.0000 mg | ORAL_CAPSULE | Freq: Two times a day (BID) | ORAL | 0 refills | Status: DC

## 2018-07-21 MED ORDER — CETIRIZINE HCL 10 MG PO CHEW: 10.0000 mg | CHEWABLE_TABLET | Freq: Every day | ORAL | 0 refills | Status: AC

## 2018-07-21 MED ORDER — BENZONATATE 100 MG PO CAPS: 100.0000 mg | ORAL_CAPSULE | Freq: Three times a day (TID) | ORAL | 0 refills | Status: AC

## 2018-07-21 NOTE — ED Provider Notes (Signed)
[facility]   [id number] 07/21/18 Arrival Time: 1006   CC: URI symptoms   SUBJECTIVE: History from: patient.  Geoffrey Thompson is a 57 y.o. male hx significant for HTN, who presents with abrupt onset of fever (tmax of 102 yesterday evening), runny nose, congestion, sore throat and cough x 2 days.  Admits to sick exposure to son with similar symptoms.  Has tried OTC medications without relief.  Denies aggravating factors.  Reports previous symptoms in the past. Complains of associated chest wall pain with coughing,   Denies chills, fatigue, SOB, wheezing, chest pain, nausea, changes in bowel or bladder habits.    Received flu shot this year: yes.  ROS: As per HPI.  Past Medical History:  Diagnosis Date  . Hypertension    History reviewed. No pertinent surgical history. No Known Allergies No current facility-administered medications on file prior to encounter.    Current Outpatient Medications on File Prior to Encounter  Medication Sig Dispense Refill  . glipiZIDE (GLUCOTROL XL) 2.5 MG 24 hr tablet Take 2.5 mg by mouth daily with breakfast.    . Multiple Vitamins-Minerals (MULTIVITAMIN ADULT PO) Take by mouth.    . olmesartan-hydrochlorothiazide (BENICAR HCT) 40-12.5 MG tablet Take 1 tablet by mouth daily.     Social History   Socioeconomic History  . Marital status: Single    Spouse name: Not on file  . Number of children: Not on file  . Years of education: Not on file  . Highest education level: Not on file  Occupational History  . Not on file  Social Needs  . Financial resource strain: Not on file  . Food insecurity:    Worry: Not on file    Inability: Not on file  . Transportation needs:    Medical: Not on file    Non-medical: Not on file  Tobacco Use  . Smoking status: Never Smoker  . Smokeless tobacco: Never Used  Substance and Sexual Activity  . Alcohol use: No  . Drug use: No  . Sexual activity: Not on file  Lifestyle  . Physical activity:   Days per week: Not on file    Minutes per session: Not on file  . Stress: Not on file  Relationships  . Social connections:    Talks on phone: Not on file    Gets together: Not on file    Attends religious service: Not on file    Active member of club or organization: Not on file    Attends meetings of clubs or organizations: Not on file    Relationship status: Not on file  . Intimate partner violence:    Fear of current or ex partner: Not on file    Emotionally abused: Not on file    Physically abused: Not on file    Forced sexual activity: Not on file  Other Topics Concern  . Not on file  Social History Narrative  . Not on file   History reviewed. No pertinent family history.  OBJECTIVE:  Vitals:   07/21/18 1056  BP: (!) 163/92  Pulse: 81  Resp: 20  Temp: 99 F (37.2 C)  TempSrc: Oral  SpO2: 98%     General appearance: alert; appears fatigued, but nontoxic; speaking in full sentences and tolerating own secretions HEENT: NCAT; Ears: EACs clear, TMs pearly gray; Eyes: PERRL.  EOM grossly intact. Sinuses: nontender; Nose: nares patent with clear rhinorrhea, Throat: oropharynx clear, tonsils non erythematous or enlarged, uvula midline  Neck: supple without  LAD Lungs: unlabored respirations, symmetrical air entry; cough: mild; no respiratory distress; CTAB Heart: regular rate and rhythm; tenderness with lateral chest wall compression Skin: warm and dry Psychological: alert and cooperative; normal mood and affect  ASSESSMENT & PLAN:  1. Flu-like symptoms   2. Costochondritis     Meds ordered this encounter  Medications  . cetirizine (ZYRTEC) 10 MG chewable tablet    Sig: Chew 1 tablet (10 mg total) by mouth daily.    Dispense:  20 tablet    Refill:  0    Order Specific Question:   Supervising Provider    Answer:   Eustace MooreNELSON, YVONNE SUE [[id number]]  . benzonatate (TESSALON) 100 MG capsule    Sig: Take 1 capsule (100 mg total) by mouth every 8 (eight) hours.     Dispense:  21 capsule    Refill:  0    Order Specific Question:   Supervising Provider    Answer:   Eustace MooreNELSON, YVONNE SUE [[id number]]  . oseltamivir (TAMIFLU) 75 MG capsule    Sig: Take 1 capsule (75 mg total) by mouth every 12 (twelve) hours.    Dispense:  10 capsule    Refill:  0    Order Specific Question:   Supervising Provider    Answer:   Eustace MooreELSON, YVONNE SUE [[id number]]    Get plenty of rest and push fluids Tessalon Perles prescribed for cough Zyrtec prescribed for runny nose, and/or sore throat Tamiflu prescribed for flu-like symptoms.  Take as directed and to completion Use OTC medications like ibuprofen or tylenol as needed fever or pain Follow up with PCP next week for recheck Return or go to ER if you have any new or worsening symptoms persistent fever, worsening cough, nausea, vomiting, chest pain, shortness of breath, wheezing, abdominal pain, changes in bowel or bladder habits, etc...  Reviewed expectations re: course of current medical issues. Questions answered. Outlined signs and symptoms indicating need for more acute intervention. Patient verbalized understanding. After Visit Summary given.         Rennis HardingWurst, Geoffrey Creps, PA-C 07/21/18 1140

## 2018-07-21 NOTE — Discharge Instructions (Signed)
Get plenty of rest and push fluids Tessalon Perles prescribed for cough Zyrtec prescribed for runny nose, and/or sore throat Tamiflu prescribed for flu-like symptoms.  Take as directed and to completion Use OTC medications like ibuprofen or tylenol as needed fever or pain Follow up with PCP next week for recheck Return or go to ER if you have any new or worsening symptoms persistent fever, worsening cough, nausea, vomiting, chest pain, shortness of breath, wheezing, abdominal pain, changes in bowel or bladder habits, etc...Marland Kitchen

## 2018-07-21 NOTE — ED Triage Notes (Signed)
Pt presents with persistent non productive cough, chest pain and generalized body aches.

## 2018-09-21 ENCOUNTER — Ambulatory Visit: Payer: PRIVATE HEALTH INSURANCE | Admitting: Family Medicine

## 2018-09-21 ENCOUNTER — Other Ambulatory Visit: Payer: Self-pay

## 2018-09-21 ENCOUNTER — Encounter: Payer: Self-pay | Admitting: Family Medicine

## 2018-09-21 ENCOUNTER — Ambulatory Visit (INDEPENDENT_AMBULATORY_CARE_PROVIDER_SITE_OTHER): Payer: PRIVATE HEALTH INSURANCE

## 2018-09-21 VITALS — BP 145/81 | HR 68 | Temp 98.6°F | Resp 16 | Ht 67.0 in | Wt 222.4 lb

## 2018-09-21 DIAGNOSIS — J209 Acute bronchitis, unspecified: Secondary | ICD-10-CM

## 2018-09-21 DIAGNOSIS — J208 Acute bronchitis due to other specified organisms: Secondary | ICD-10-CM | POA: Diagnosis not present

## 2018-09-21 DIAGNOSIS — R059 Cough, unspecified: Secondary | ICD-10-CM

## 2018-09-21 DIAGNOSIS — R05 Cough: Secondary | ICD-10-CM

## 2018-09-21 DIAGNOSIS — I10 Essential (primary) hypertension: Secondary | ICD-10-CM

## 2018-09-21 MED ORDER — AZITHROMYCIN 250 MG PO TABS: ORAL_TABLET | ORAL | 0 refills | Status: DC

## 2018-09-21 NOTE — Progress Notes (Signed)
Established Patient Office Visit  Subjective:  Patient ID: Geoffrey Thompson, male    DOB: 12/24/1960  Age: 58 y.o. MRN: [id number]  CC:  Chief Complaint  Patient presents with  . Cough    x 2 days, "dark yellow mucus, when I cough it hurtst in my chest"    HPI Geoffrey Thompson presents for  Pt reports that he has been having a productive cough with yellow mucus for the past 2 days He is taking mucinex and tessalon perles He denies history of asthma he states that his children have been coughing   Essential Hypertension  He has hypertension  He does not use otc cold meds and cough syrups He gets some chest soreness with coughing BP Readings from Last 3 Encounters:  09/21/18 (!) 145/81  07/21/18 (!) 163/92  05/11/18 140/85   He reports that he took his bp medication when he got to the clinic this morning He worked the night shift as well  Past Medical History:  Diagnosis Date  . Hypertension     No past surgical history on file.  No family history on file.  Social History   Socioeconomic History  . Marital status: Single    Spouse name: Not on file  . Number of children: Not on file  . Years of education: Not on file  . Highest education level: Not on file  Occupational History  . Not on file  Social Needs  . Financial resource strain: Not on file  . Food insecurity:    Worry: Not on file    Inability: Not on file  . Transportation needs:    Medical: Not on file    Non-medical: Not on file  Tobacco Use  . Smoking status: Never Smoker  . Smokeless tobacco: Never Used  Substance and Sexual Activity  . Alcohol use: No  . Drug use: No  . Sexual activity: Not on file  Lifestyle  . Physical activity:    Days per week: Not on file    Minutes per session: Not on file  . Stress: Not on file  Relationships  . Social connections:    Talks on phone: Not on file    Gets together: Not on file    Attends religious service: Not on file    Active member of  club or organization: Not on file    Attends meetings of clubs or organizations: Not on file    Relationship status: Not on file  . Intimate partner violence:    Fear of current or ex partner: Not on file    Emotionally abused: Not on file    Physically abused: Not on file    Forced sexual activity: Not on file  Other Topics Concern  . Not on file  Social History Narrative  . Not on file    Outpatient Medications Prior to Visit  Medication Sig Dispense Refill  . benzonatate (TESSALON) 100 MG capsule Take 1 capsule (100 mg total) by mouth every 8 (eight) hours. 21 capsule 0  . glipiZIDE (GLUCOTROL XL) 2.5 MG 24 hr tablet Take 2.5 mg by mouth daily with breakfast.    . Multiple Vitamins-Minerals (MULTIVITAMIN ADULT PO) Take by mouth.    . olmesartan-hydrochlorothiazide (BENICAR HCT) 40-12.5 MG tablet Take 1 tablet by mouth daily.    . cetirizine (ZYRTEC) 10 MG chewable tablet Chew 1 tablet (10 mg total) by mouth daily. (Patient not taking: Reported on 09/21/2018) 20 tablet 0  . oseltamivir (TAMIFLU)  75 MG capsule Take 1 capsule (75 mg total) by mouth every 12 (twelve) hours. 10 capsule 0   No facility-administered medications prior to visit.     No Known Allergies  ROS Review of Systems Review of Systems  Constitutional: Negative for activity change, appetite change, chills and fever.  HENT: Negative for congestion, nosebleeds, trouble swallowing and voice change.   Respiratory: see hpi  Gastrointestinal: Negative for diarrhea, nausea and vomiting.  Genitourinary: Negative for difficulty urinating, dysuria, flank pain and hematuria.  Musculoskeletal: Negative for back pain, joint swelling and neck pain.  Neurological: Negative for dizziness, speech difficulty, light-headedness and numbness.  See HPI. All other review of systems negative.     Objective:    Physical Exam  BP (!) 145/81 (BP Location: Right Arm, Patient Position: Sitting, Cuff Size: Normal)   Pulse 68   Temp  98.6 F (37 C) (Oral)   Resp 16   Ht 5\' 7"  (1.702 m)   Wt 222 lb 6.4 oz (100.9 kg)   SpO2 98%   BMI 34.83 kg/m  Wt Readings from Last 3 Encounters:  09/21/18 222 lb 6.4 oz (100.9 kg)  05/11/18 214 lb (97.1 kg)  12/27/16 212 lb 6.4 oz (96.3 kg)   General: alert, oriented, in NAD Head: normocephalic, atraumatic, no sinus tenderness Eyes: EOM intact, no scleral icterus or conjunctival injection Ears: TM clear bilaterally Nose: mucosa nonerythematous, nonedematous Throat: no pharyngeal exudate or erythema Lymph: no posterior auricular, submental or cervical lymph adenopathy Heart: normal rate, normal sinus rhythm, no murmurs Lungs: clear to auscultation bilaterally, no wheezing    Health Maintenance Due  Topic Date Due  . Hepatitis C Screening  09-01-1960  . HIV Screening  07/24/1975  . TETANUS/TDAP  07/24/1979  . COLONOSCOPY  07/23/2010    There are no preventive care reminders to display for this patient.     Assessment & Plan:   Problem List Items Addressed This Visit    None    Visit Diagnoses    Cough    -  Primary   Relevant Orders   DG Chest 2 View (Completed)   Acute bronchitis due to infection    - based on history and xray which showed bronchitis will start zpak   Relevant Medications   azithromycin (ZITHROMAX) 250 MG tablet   Essential hypertension    -  Discussed meds safe to use that wont increase bp and pulse      Meds ordered this encounter  Medications  . azithromycin (ZITHROMAX) 250 MG tablet    Sig: Take 2 tablets on day 1 then one tablet each day after    Dispense:  6 tablet    Refill:  0    Follow-up: No follow-ups on file.    Doristine Bosworth, MD

## 2018-09-21 NOTE — Patient Instructions (Addendum)
    CLINICAL DATA:  Productive cough.  EXAM: CHEST - 2 VIEW  COMPARISON:  05/11/2018 and 12/27/2016  FINDINGS: The heart size and mediastinal contours are within normal limits. Both lungs are clear except for slight peribronchial thickening on the lateral view. The visualized skeletal structures are unremarkable.  IMPRESSION: Slight bronchitic changes.   Electronically Signed   By: Francene Boyers M.D.   On: 09/21/2018 11:38   If you have lab work done today you will be contacted with your lab results within the next 2 weeks.  If you have not heard from [place] then please contact us. The fastest way to get your results is to register for My Chart.   IF you received an x-ray today, you will receive an invoice from [facility]. Please contact [facility] at [phone] with questions or concerns regarding your invoice.   IF you received labwork today, you will receive an invoice from [facility]. Please contact [facility] at [phone] with questions or concerns regarding your invoice.   Our billing staff will not be able to assist you with questions regarding bills from these companies.  You will be contacted with the lab results as soon as they are available. The fastest way to get your results is to activate your My Chart account. Instructions are located on the last page of this paperwork. If you have not heard from [place] regarding the results in 2 weeks, please contact this office.

## 2019-12-23 ENCOUNTER — Telehealth (INDEPENDENT_AMBULATORY_CARE_PROVIDER_SITE_OTHER): Payer: PRIVATE HEALTH INSURANCE | Admitting: Registered Nurse

## 2019-12-23 ENCOUNTER — Other Ambulatory Visit: Payer: Self-pay

## 2019-12-23 ENCOUNTER — Encounter: Payer: Self-pay | Admitting: Registered Nurse

## 2019-12-23 DIAGNOSIS — J208 Acute bronchitis due to other specified organisms: Secondary | ICD-10-CM

## 2019-12-23 DIAGNOSIS — J209 Acute bronchitis, unspecified: Secondary | ICD-10-CM

## 2019-12-23 MED ORDER — AZITHROMYCIN 250 MG PO TABS: ORAL_TABLET | ORAL | 0 refills | Status: AC

## 2019-12-23 NOTE — Progress Notes (Signed)
Telemedicine Encounter- SOAP NOTE Established Patient  This telephone encounter was conducted with the patient's (or proxy's) verbal consent via audio telecommunications: yes  Patient was instructed to have this encounter in a suitably private space; and to only have persons present to whom they give permission to participate. In addition, patient identity was confirmed by use of name plus two identifiers (DOB and address).  I discussed the limitations, risks, security and privacy concerns of performing an evaluation and management service by telephone and the availability of in person appointments. I also discussed with the patient that there may be a patient responsible charge related to this service. The patient expressed understanding and agreed to proceed.  I spent a total of 11 minutes talking with the patient or their proxy.  Chief Complaint  Patient presents with  . Cough    patient states he have been having a cough since friday and also a stuffy nose. Per patient he has took some OTC medication and had some relief but not much. Patient stated that the stuffy nose was only for a little while.    Subjective   Geoffrey Thompson is a 59 y.o. established patient. Telephone visit today for cough  HPI Onset Th-Fri Feels like it is located in his chest, Productive with thick yellow-green mucus Intermittent symptoms that are worse during the day Has tried OTCs without much relief Reports that his son had similar symptoms recently Has been vaccinated against COVID - doses on Dec 30 and Jan 20  Denies fevers, chills, fatigue, shob, doe, lightheadedness, chest pain, palpitations, nvd.   Patient Active Problem List   Diagnosis Date Noted  . Wheezing 11/29/2016    Past Medical History:  Diagnosis Date  . Hypertension     Current Outpatient Medications  Medication Sig Dispense Refill  . glipiZIDE (GLUCOTROL XL) 2.5 MG 24 hr tablet Take 2.5 mg by mouth daily with breakfast.      . Multiple Vitamins-Minerals (MULTIVITAMIN ADULT PO) Take by mouth.    Marland Kitchen azithromycin (ZITHROMAX) 250 MG tablet Take 2 tablets on day 1 then one tablet each day after (Patient not taking: Reported on 12/23/2019) 6 tablet 0  . benzonatate (TESSALON) 100 MG capsule Take 1 capsule (100 mg total) by mouth every 8 (eight) hours. (Patient not taking: Reported on 12/23/2019) 21 capsule 0  . cetirizine (ZYRTEC) 10 MG chewable tablet Chew 1 tablet (10 mg total) by mouth daily. (Patient not taking: Reported on 09/21/2018) 20 tablet 0  . olmesartan-hydrochlorothiazide (BENICAR HCT) 40-12.5 MG tablet Take 1 tablet by mouth daily.     No current facility-administered medications for this visit.    No Known Allergies  Social History   Socioeconomic History  . Marital status: Single    Spouse name: Not on file  . Number of children: Not on file  . Years of education: Not on file  . Highest education level: Not on file  Occupational History  . Not on file  Tobacco Use  . Smoking status: Never Smoker  . Smokeless tobacco: Never Used  Substance and Sexual Activity  . Alcohol use: No  . Drug use: No  . Sexual activity: Not on file  Other Topics Concern  . Not on file  Social History Narrative  . Not on file   Social Determinants of Health   Financial Resource Strain:   . Difficulty of Paying Living Expenses:   Food Insecurity:   . Worried About Programme researcher, broadcasting/film/video in the  Last Year:   . [place] in the Last Year:   Transportation Needs:   . Film/video editor (Medical):   Marland Kitchen Lack of Transportation (Non-Medical):   Physical Activity:   . Days of Exercise per Week:   . Minutes of Exercise per Session:   Stress:   . Feeling of Stress :   Social Connections:   . Frequency of Communication with Friends and Family:   . Frequency of Social Gatherings with Friends and Family:   . Attends Religious Services:   . Active Member of Clubs or Organizations:   . Attends Theatre manager Meetings:   Marland Kitchen Marital Status:   Intimate Partner Violence:   . Fear of Current or Ex-Partner:   . Emotionally Abused:   Marland Kitchen Physically Abused:   . Sexually Abused:     Review of Systems  Constitutional: Negative.   HENT: Negative.   Eyes: Negative.   Respiratory: Negative.   Cardiovascular: Negative.   Gastrointestinal: Negative.   Genitourinary: Negative.   Musculoskeletal: Negative.   Skin: Negative.   Neurological: Negative.   Endo/Heme/Allergies: Negative.   Psychiatric/Behavioral: Negative.   All other systems reviewed and are negative.   Objective   Vitals as reported by the patient: There were no vitals filed for this visit.  Caine was seen today for cough.  Diagnoses and all orders for this visit:  Acute bronchitis due to infection -     azithromycin (ZITHROMAX) 250 MG tablet; Take 2 tabs on first day. Then take 1 tab daily. Finish entire supply.   PLAN  Given his history, this appears to be an infectious process likely caused by a bacteria. Will give z pack  Continue OTCs  Return precautions reviewed  Not likely covid given pertinent negatives and hx of vaccination  Patient encouraged to call clinic with any questions, comments, or concerns.  I discussed the assessment and treatment plan with the patient. The patient was provided an opportunity to ask questions and all were answered. The patient agreed with the plan and demonstrated an understanding of the instructions.   The patient was advised to call back or seek an in-person evaluation if the symptoms worsen or if the condition fails to improve as anticipated.  I provided 11 minutes of non-face-to-face time during this encounter.  Maximiano Coss, NP  Primary Care at [facility]

## 2019-12-23 NOTE — Patient Instructions (Signed)
° ° ° °  If you have lab work done today you will be contacted with your lab results within the next 2 weeks.  If you have not heard from us then please contact us. The fastest way to get your results is to register for My Chart. ° ° °IF you received an x-ray today, you will receive an invoice from [facility]. Please contact [facility] at [phone] with questions or concerns regarding your invoice.  ° °IF you received labwork today, you will receive an invoice from [facility]. Please contact [facility] at [phone] with questions or concerns regarding your invoice.  ° °Our billing staff will not be able to assist you with questions regarding bills from these companies. ° °You will be contacted with the lab results as soon as they are available. The fastest way to get your results is to activate your My Chart account. Instructions are located on the last page of this paperwork. If you have not heard from us regarding the results in 2 weeks, please contact this office. °  ° ° ° °

## 2020-09-18 ENCOUNTER — Other Ambulatory Visit: Payer: Self-pay

## 2020-09-18 ENCOUNTER — Other Ambulatory Visit (HOSPITAL_COMMUNITY): Payer: Self-pay | Admitting: Cardiovascular Disease

## 2020-09-18 ENCOUNTER — Ambulatory Visit (HOSPITAL_COMMUNITY)
Admission: RE | Admit: 2020-09-18 | Discharge: 2020-09-18 | Disposition: A | Discharge status: Home / Self Care | Payer: PRIVATE HEALTH INSURANCE | Source: Ambulatory Visit | Attending: Cardiovascular Disease | Admitting: Cardiovascular Disease

## 2020-09-18 DIAGNOSIS — M7989 Other specified soft tissue disorders: Secondary | ICD-10-CM | POA: Diagnosis not present

## 2020-09-18 DIAGNOSIS — M79604 Pain in right leg: Secondary | ICD-10-CM | POA: Diagnosis not present

## 2020-09-18 NOTE — Progress Notes (Signed)
Lower extremity venous RT study completed.  Preliminary results relayed to Algie Coffer, MD.   See CV Proc for preliminary results report.   Jean Rosenthal, RDMS

## 2020-11-17 ENCOUNTER — Other Ambulatory Visit: Payer: Self-pay | Admitting: Cardiovascular Disease

## 2020-11-17 ENCOUNTER — Ambulatory Visit
Admission: RE | Admit: 2020-11-17 | Discharge: 2020-11-17 | Disposition: A | Discharge status: Home / Self Care | Payer: PRIVATE HEALTH INSURANCE | Source: Ambulatory Visit | Attending: Cardiovascular Disease | Admitting: Cardiovascular Disease

## 2020-11-17 DIAGNOSIS — R609 Edema, unspecified: Secondary | ICD-10-CM

## 2020-11-17 DIAGNOSIS — R52 Pain, unspecified: Secondary | ICD-10-CM

## 2021-03-01 ENCOUNTER — Other Ambulatory Visit: Payer: Self-pay | Admitting: Cardiovascular Disease

## 2021-03-01 ENCOUNTER — Ambulatory Visit
Admission: RE | Admit: 2021-03-01 | Discharge: 2021-03-01 | Disposition: A | Discharge status: Home / Self Care | Payer: PRIVATE HEALTH INSURANCE | Source: Ambulatory Visit | Attending: Cardiovascular Disease | Admitting: Cardiovascular Disease

## 2021-03-01 DIAGNOSIS — U071 COVID-19: Secondary | ICD-10-CM

## 2022-05-14 IMAGING — CR DG CHEST 2V
2 series · 2 of 2 positions shown · non-contrast
Comparison: 01/06/2021

CLINICAL DATA: Cough, question pneumonia, NAVSD-PL

EXAM:
CHEST - 2 VIEW

[w chest pa]
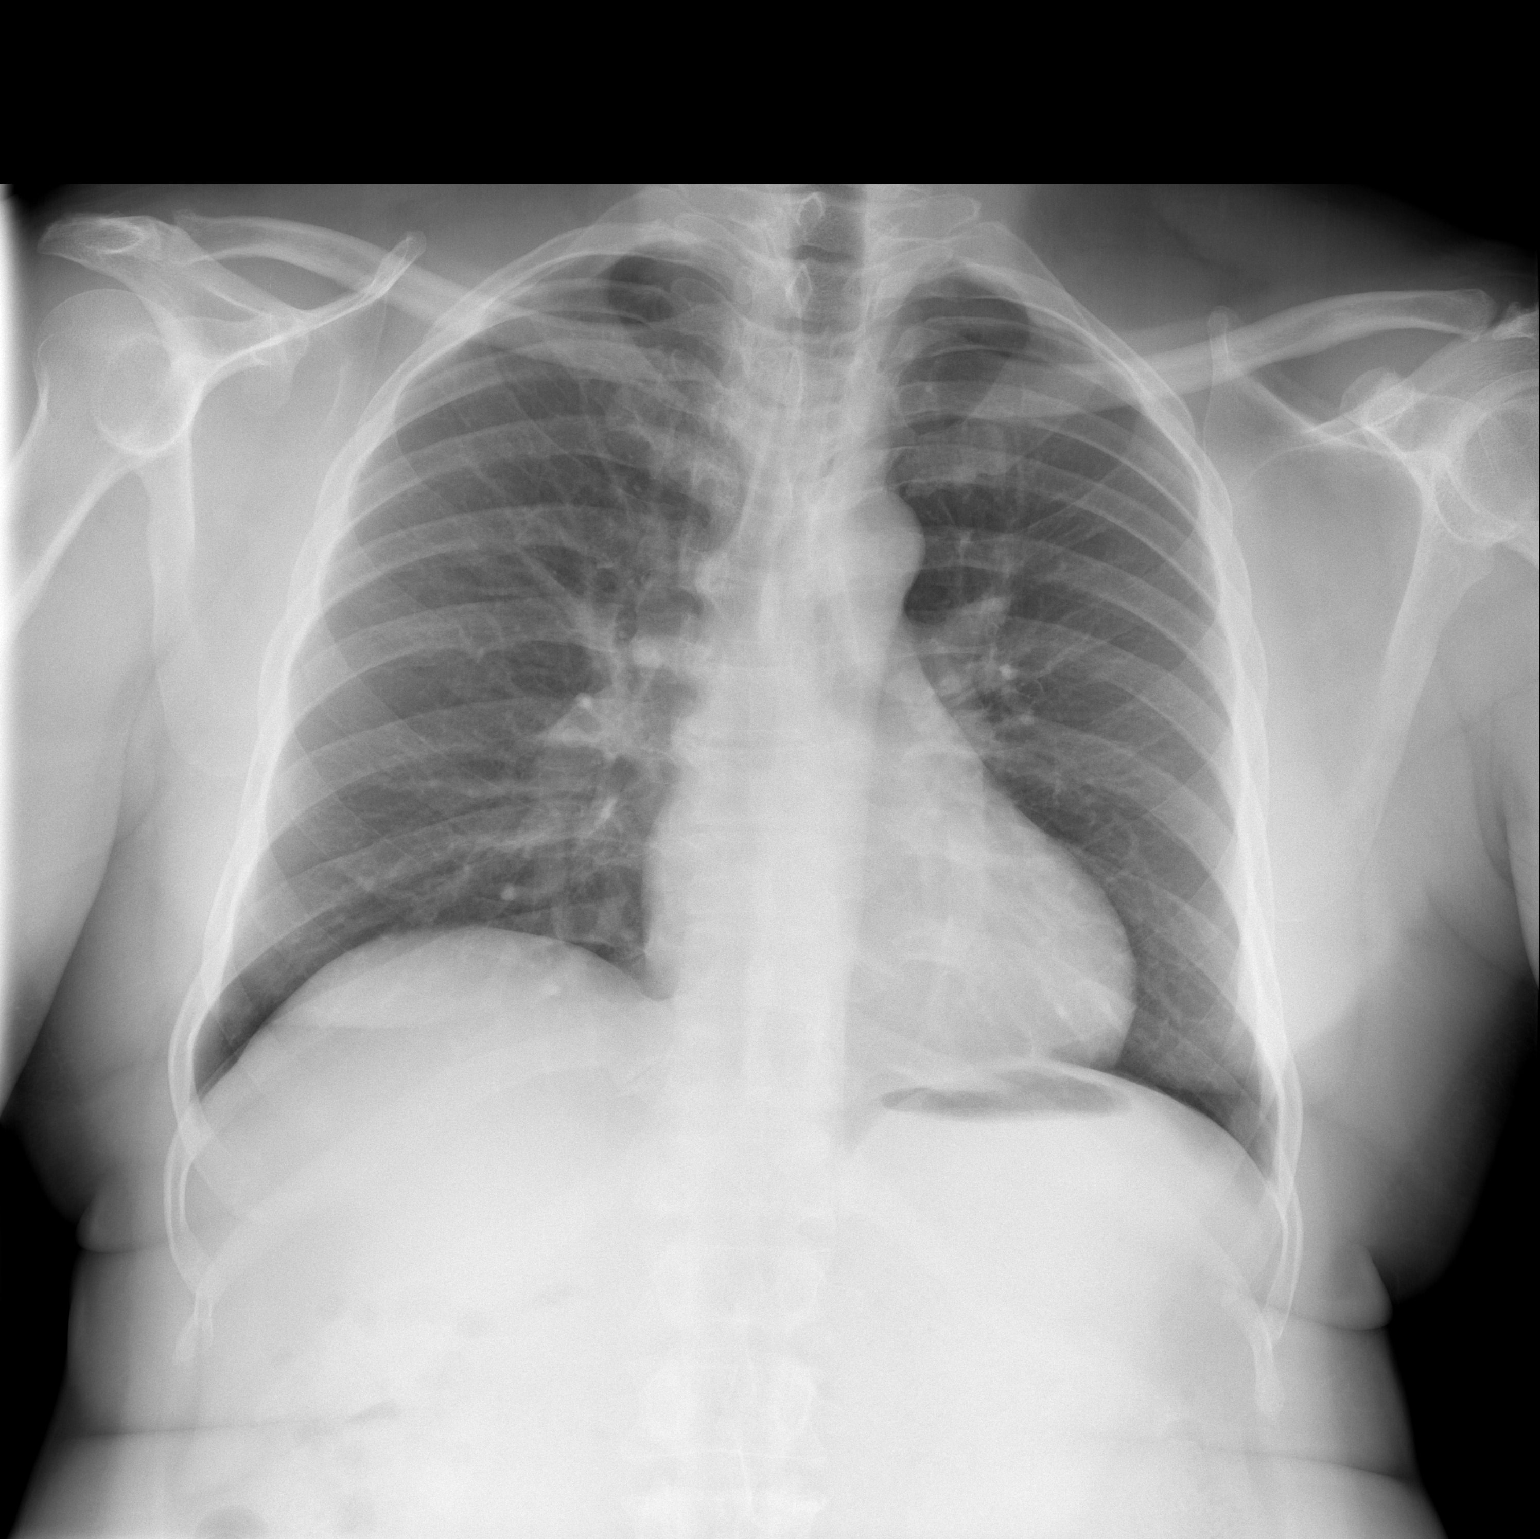

[w chest lat]
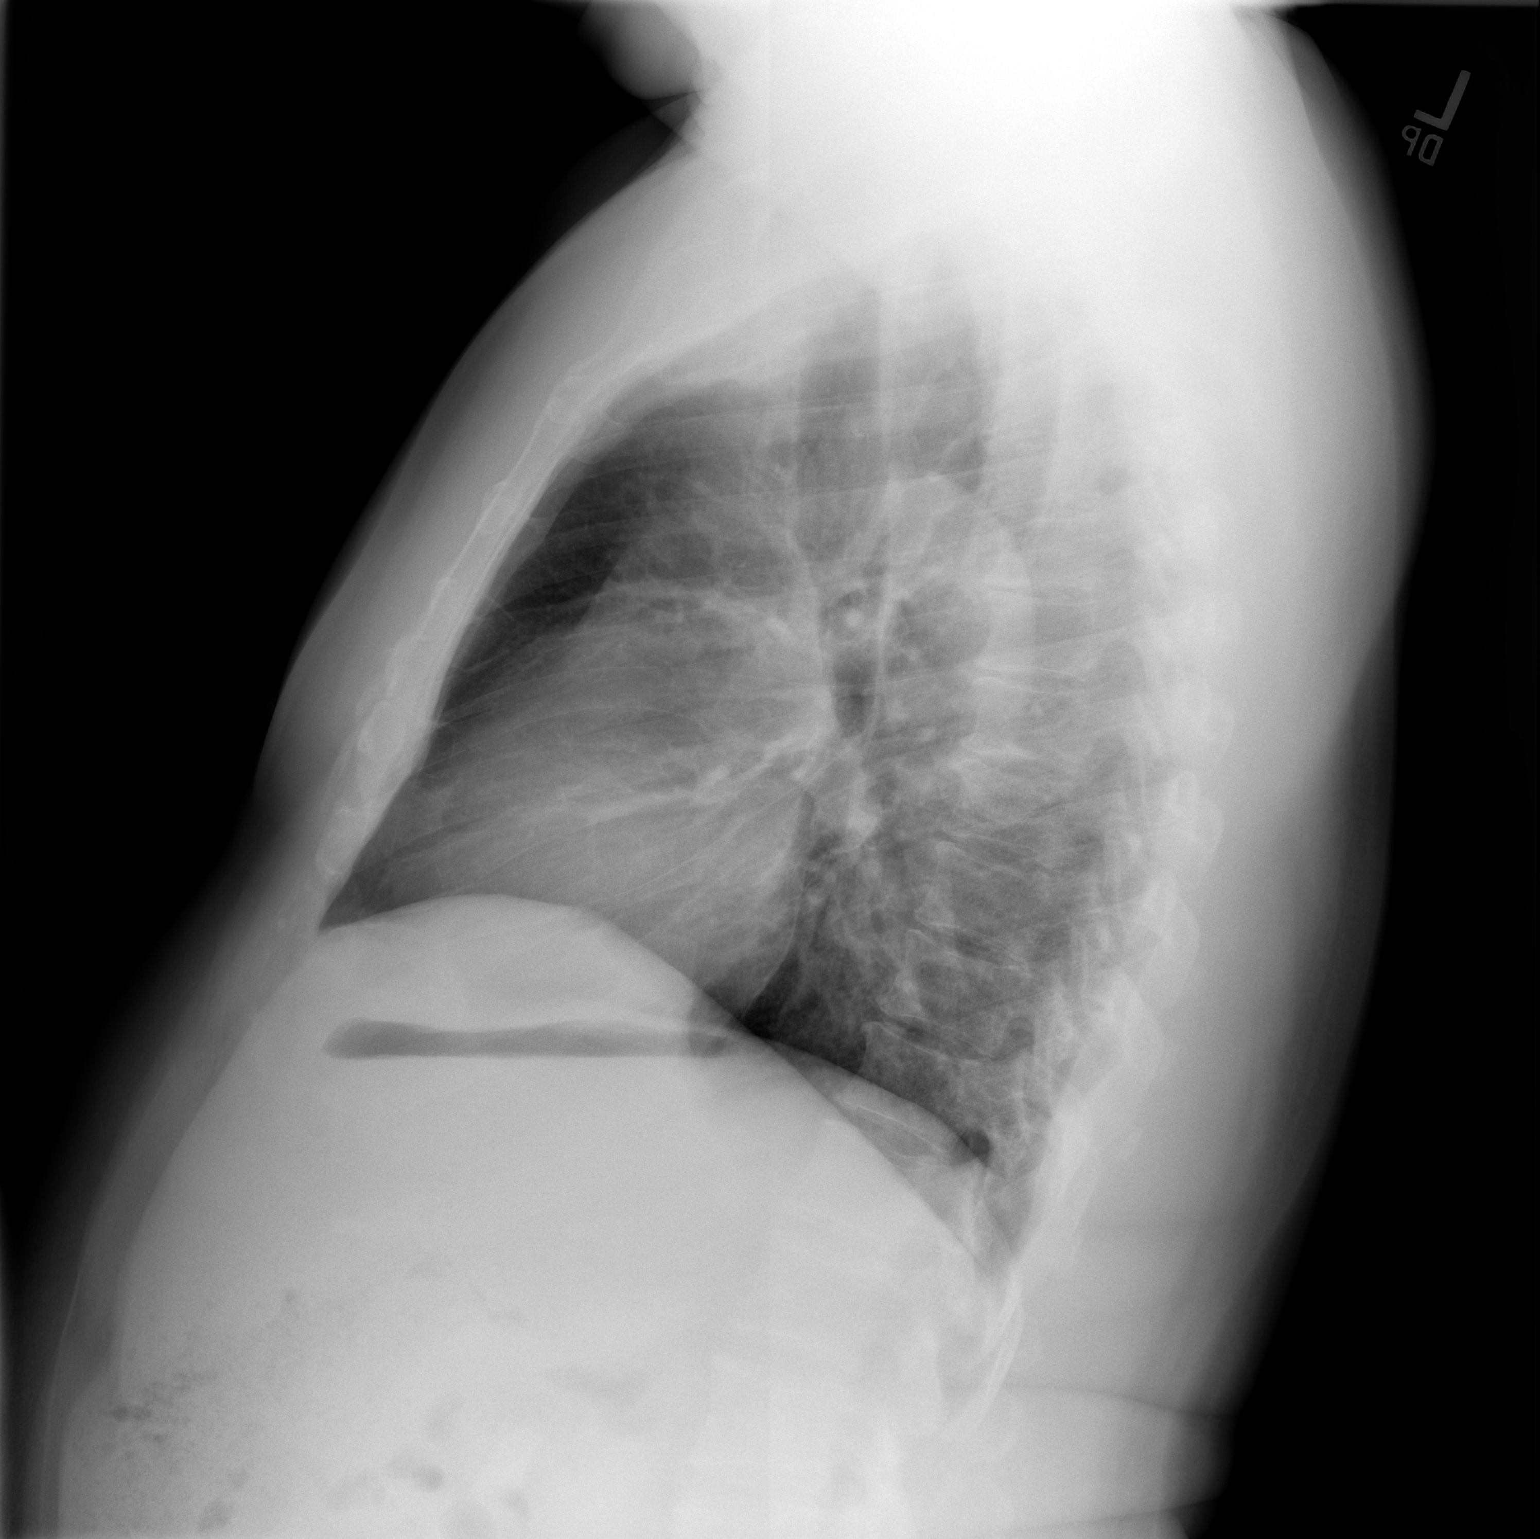

[2 of 2 positions shown; findings below may reference images not displayed]

FINDINGS: Normal heart size, mediastinal contours, and pulmonary vascularity.

Lungs clear.

No pleural effusion or pneumothorax.

Bones unremarkable.
IMPRESSION: Normal exam.

## 2022-05-19 ENCOUNTER — Other Ambulatory Visit: Payer: Self-pay | Admitting: Cardiovascular Disease

## 2022-05-19 ENCOUNTER — Ambulatory Visit
Admission: RE | Admit: 2022-05-19 | Discharge: 2022-05-19 | Disposition: A | Discharge status: Home / Self Care | Payer: PRIVATE HEALTH INSURANCE | Source: Ambulatory Visit | Attending: Cardiovascular Disease | Admitting: Cardiovascular Disease

## 2022-05-19 DIAGNOSIS — M545 Low back pain, unspecified: Secondary | ICD-10-CM
